# Patient Record
Sex: Male | Born: 1990 | Race: Black or African American | Hispanic: No | Marital: Single | Smoking: Current every day smoker
Health system: Southern US, Community
[De-identification: ages and names within clinical notes are randomized; demographics above are authoritative.]

---

## 2017-10-18 ENCOUNTER — Other Ambulatory Visit: Payer: Self-pay

## 2017-10-18 ENCOUNTER — Emergency Department (HOSPITAL_COMMUNITY): Payer: Self-pay

## 2017-10-18 ENCOUNTER — Emergency Department (HOSPITAL_COMMUNITY)
Admission: EM | Admit: 2017-10-18 | Discharge: 2017-10-18 | Disposition: A | Discharge status: Home / Self Care | Payer: Self-pay | Attending: Physician Assistant | Admitting: Physician Assistant

## 2017-10-18 ENCOUNTER — Encounter (HOSPITAL_COMMUNITY): Payer: Self-pay | Admitting: Emergency Medicine

## 2017-10-18 DIAGNOSIS — F17210 Nicotine dependence, cigarettes, uncomplicated: Secondary | ICD-10-CM | POA: Insufficient documentation

## 2017-10-18 DIAGNOSIS — R0789 Other chest pain: Secondary | ICD-10-CM | POA: Insufficient documentation

## 2017-10-18 LAB — BASIC METABOLIC PANEL
Anion gap: 9 (ref 5–15)
BUN: 6 mg/dL (ref 6–20)
CHLORIDE: 104 mmol/L (ref 101–111)
CO2: 26 mmol/L (ref 22–32)
Calcium: 9.5 mg/dL (ref 8.9–10.3)
Creatinine, Ser: 0.96 mg/dL (ref 0.61–1.24)
GFR calc Af Amer: >60 mL/min (ref >60)
GFR calc non Af Amer: >60 mL/min (ref >60)
GLUCOSE: 95 mg/dL (ref 65–99)
POTASSIUM: 4.1 mmol/L (ref 3.5–5.1)
Sodium: 139 mmol/L (ref 135–145)

## 2017-10-18 LAB — I-STAT TROPONIN, ED: Troponin i, poc: 0 ng/mL (ref 0.00–0.08)

## 2017-10-18 LAB — CBC
HEMATOCRIT: 46.7 % (ref 39.0–52.0)
Hemoglobin: 16.3 g/dL (ref 13.0–17.0)
MCH: 31.8 pg (ref 26.0–34.0)
MCHC: 34.9 g/dL (ref 30.0–36.0)
MCV: 91 fL (ref 78.0–100.0)
Platelets: 183 10*3/uL (ref 150–400)
RBC: 5.13 MIL/uL (ref 4.22–5.81)
RDW: 12.1 % (ref 11.5–15.5)
WBC: 5.4 10*3/uL (ref 4.0–10.5)

## 2017-10-18 MED ORDER — TRAMADOL HCL 50 MG PO TABS: 50.0000 mg | ORAL_TABLET | Freq: Four times a day (QID) | ORAL | 0 refills | Status: AC | PRN

## 2017-10-18 MED ORDER — KETOROLAC TROMETHAMINE 30 MG/ML IJ SOLN
30.0000 mg | Freq: Once | INTRAMUSCULAR | Status: AC
  Administered 2017-10-18: 30 mg via INTRAMUSCULAR
  Filled 2017-10-18: qty 1

## 2017-10-18 NOTE — ED Provider Notes (Signed)
[facility]   CSN: [id number] Arrival date & time: 10/18/17  1310   History   Chief Complaint Chief Complaint  Patient presents with  . Chest Pain    HPI Randy Hernandez is a 27 y.o. male.  HPI   27 year old male presents today with complaints of left-sided chest pain.  Patient notes symptoms started last night with sharp anterior lateral chest wall pain.  Worse with movement of his left arm.  Patient denies any shortness of breath, fever, cough.  Patient notes yesterday the pain was severe causing episode of dizziness and nausea.  Patient reports symptoms have improved today but still present.  Any range of motion of the left shoulder causes pain in the chest.  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  Patient reports he is a smoker, denies any cardiac history in him and his family, no history DVT or PE, no significant risk factors, no lower extremity swelling or edema.    History reviewed. No pertinent past medical history.  There are no active problems to display for this patient.   History is reviewed     Home Medications    Prior to Admission medications   Medication Sig Start Date End Date Taking? Authorizing Provider  traMADol (ULTRAM) 50 MG tablet Take 1 tablet (50 mg total) by mouth every 6 (six) hours as needed. 10/18/17   Eyvonne MechanicHedges, Rain Friedt, PA-C    Family History No family history on file.  Social History Social History   Tobacco Use  . Smoking status: Current Every Day Smoker  . Smokeless tobacco: Never Used  Substance Use Topics  . Alcohol use: Yes  . Drug use: No     Allergies   Shellfish-derived products   Review of Systems Review of Systems  All other systems reviewed and are negative.  Physical Exam Updated Vital Signs BP 119/83   Pulse 61   Temp 98.6 F (37 C) (Oral)   Resp 16   Ht 6' (1.829 m)   Wt 66.2 kg (146 lb)   SpO2 93%    BMI 19.80 kg/m   Physical Exam  Constitutional: He is oriented to person, place, and time. He appears well-developed and well-nourished.  HENT:  Head: Normocephalic and atraumatic.  Eyes: Conjunctivae are normal. Pupils are equal, round, and reactive to light. Right eye exhibits no discharge. Left eye exhibits no discharge. No scleral icterus.  Neck: Normal range of motion. No JVD present. No tracheal deviation present.  Cardiovascular: Normal rate, regular rhythm, normal heart sounds and intact distal pulses. Exam reveals no gallop and no friction rub.  No murmur heard. Pulmonary/Chest: Effort normal. No stridor. No respiratory distress. He has no wheezes. He has no rales. He exhibits tenderness.  Neurological: He is alert and oriented to person, place, and time. Coordination normal.  Psychiatric: He has a normal mood and affect. His behavior is normal. Judgment and thought content normal.  Nursing note and vitals reviewed.   ED Treatments / Results  Labs (all labs ordered are listed, but only abnormal results are displayed) Labs Reviewed  BASIC METABOLIC PANEL  CBC  I-STAT TROPONIN, ED    EKG  EKG Interpretation None      Radiology Dg Chest 2 View  Result Date: 10/18/2017 CLINICAL DATA:  Chest pain, nausea, and dizziness. EXAM: CHEST  2 VIEW COMPARISON:  None. FINDINGS: The cardiomediastinal silhouette is within normal limits. The lungs are  well inflated and clear. There is no evidence of pleural effusion or pneumothorax. No acute osseous abnormality is identified. IMPRESSION: No active cardiopulmonary disease. Electronically Signed   By: Sebastian Ache M.D.   On: 10/18/2017 14:24    Procedures Procedures (including critical care time)  Medications Ordered in ED Medications  ketorolac (TORADOL) 30 MG/ML injection 30 mg (30 mg Intramuscular Given 10/18/17 1827)     Initial Impression / Assessment and Plan / ED Course  I have reviewed the triage vital signs and the nursing  notes.  Pertinent labs & imaging results that were available during my care of the patient were reviewed by me and considered in my medical decision making (see chart for details).    Final Clinical Impressions(s) / ED Diagnoses   Final diagnoses:  Chest wall pain    Labs: BMP, CBC, i-STAT troponin  Imaging: DG chest 2 view-EKG reviewed without acute abnormalities  Consults:  Therapeutics: toradol  Discharge Meds:  Ultram   Assessment/Plan: 27 year old male presents today with complaints of chest pain.  This is likely chest wall pain reproducible with movement of the left shoulder reproducible with palpation of the chest wall.  Patient has no significant cardiac history has a very low heart score reassuring laboratory analysis with no elevation in troponin, no significant EKG changes.  Patient has reassuring vital signs.  In the chart it shows his oxygen in the mid to low 90s, this is secondary to inappropriate placed O2 sensor is a replaced the sensor in the appropriate position oxygen goes up to 99 and 100.  I have very low suspicion for PE in this patient he has a low Wells score and is PERC negative.  Patient treated here with Toradol, encouraged to use ibuprofen or Tylenol, Ultram as needed, patient is given strict return precautions, he verbalized understanding and agreement to today's plan had no further questions or concerns at time of discharge.   ED Discharge Orders        Ordered    traMADol (ULTRAM) 50 MG tablet  Every 6 hours PRN     10/18/17 1810       Eyvonne Mechanic, PA-C 10/18/17 1847    Corlis Leak, Cindee Salt, MD 10/19/17 1845

## 2017-10-18 NOTE — Discharge Instructions (Signed)
Please read attached information. If you experience any new or worsening signs or symptoms please return to the emergency room for evaluation. Please follow-up with your primary care provider or specialist as discussed. Please use medication prescribed only as directed and discontinue taking if you have any concerning signs or symptoms.   °

## 2017-10-18 NOTE — ED Triage Notes (Signed)
Cp left that started last night, was able to sleep but this am, hurts to take a deep breath, inhale and exhale he states, felt nauseous and very  dizzy last night , cough ot hj/a

## 2018-10-24 IMAGING — DX DG CHEST 2V
2 series · 2 of 2 positions shown · non-contrast
Comparison: None.

CLINICAL DATA: Chest pain, nausea, and dizziness.

EXAM:
CHEST  2 VIEW

[chest pa]
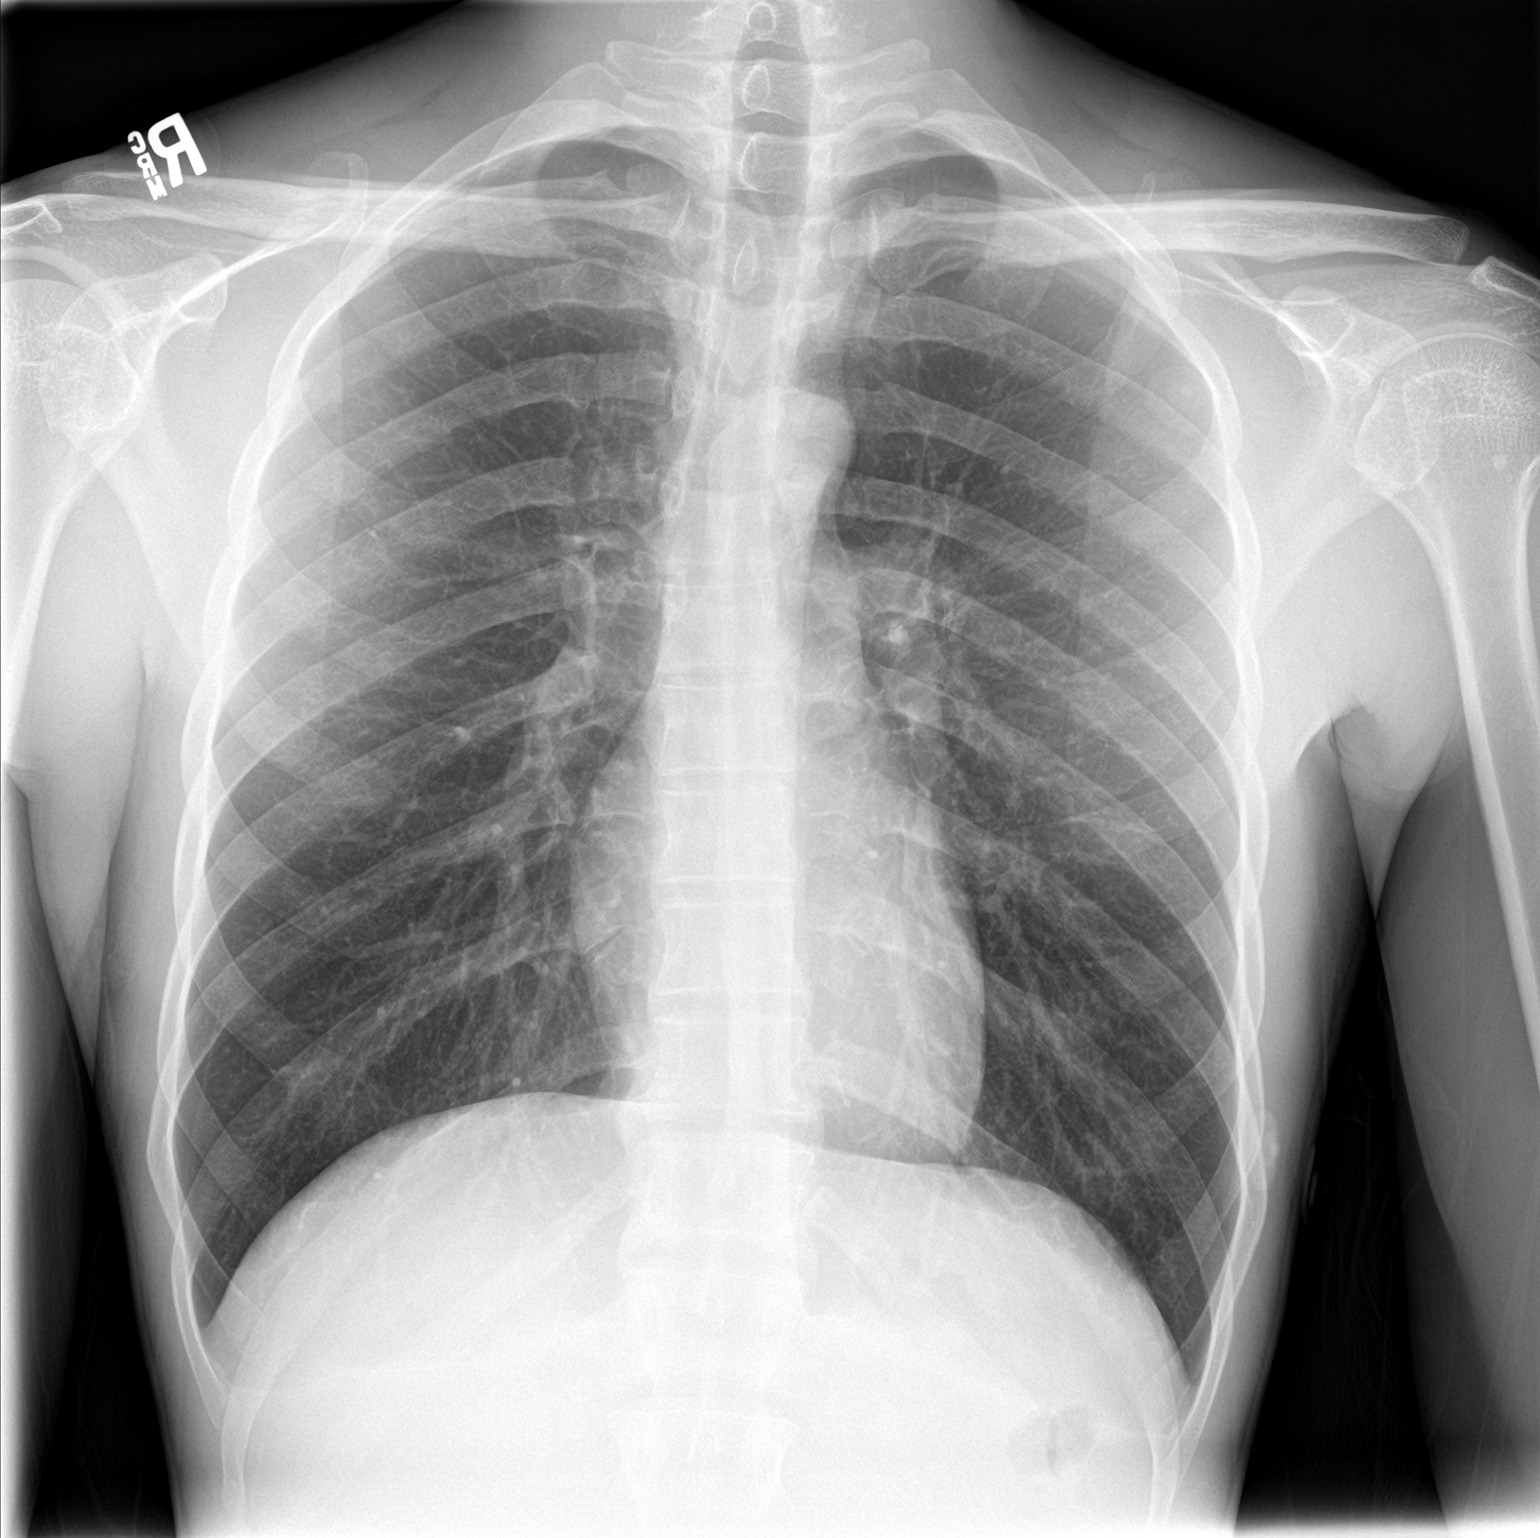

[chest lat]
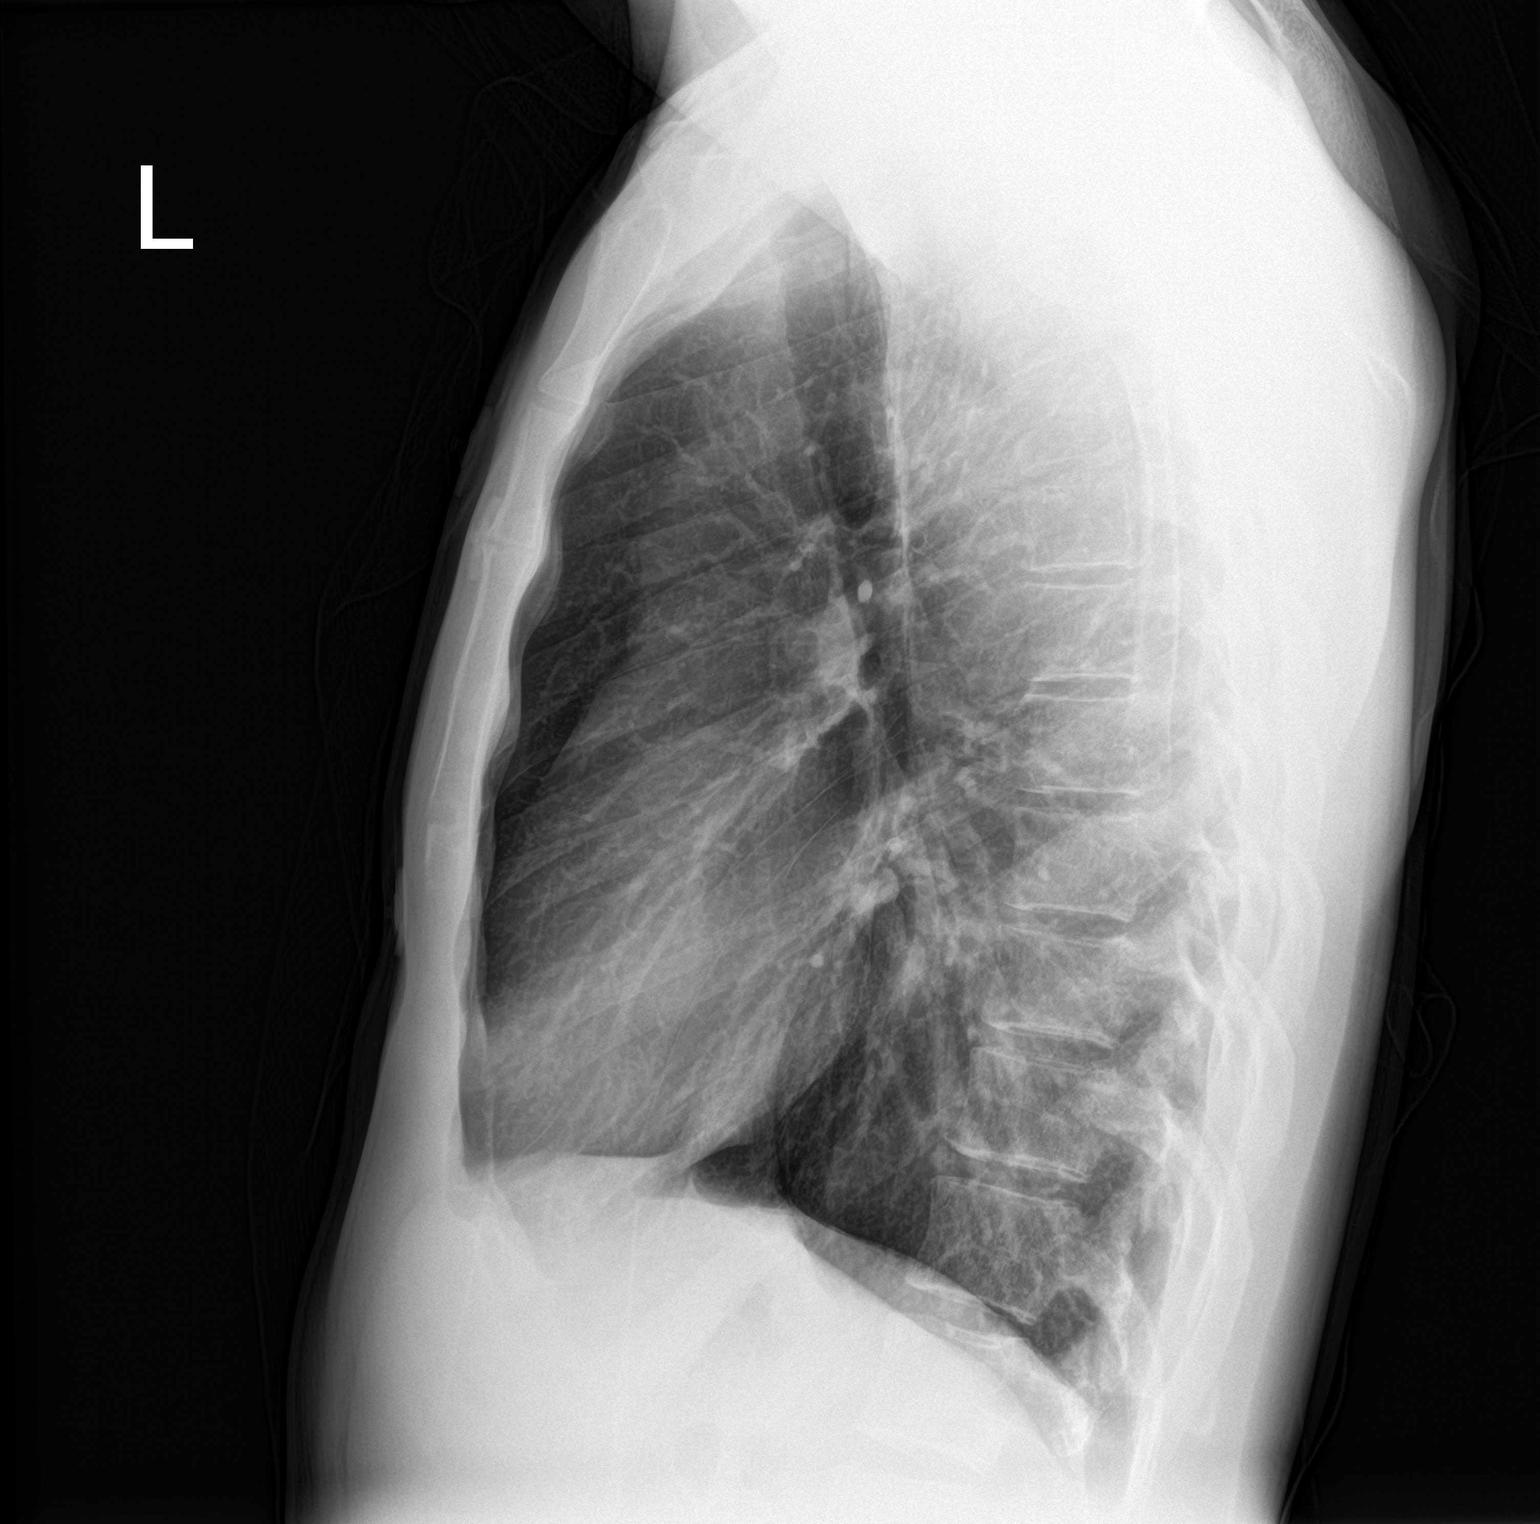

[2 of 2 positions shown; findings below may reference images not displayed]

FINDINGS: The cardiomediastinal silhouette is within normal limits. The lungs
are well inflated and clear. There is no evidence of pleural
effusion or pneumothorax. No acute osseous abnormality is
identified.
IMPRESSION: No active cardiopulmonary disease.
